# Patient Record
Sex: Male | Born: 1960 | Race: White | Hispanic: No | Marital: Married
Health system: Southern US, Community
[De-identification: ages and names within clinical notes are randomized; demographics above are authoritative.]

## PROBLEM LIST (undated history)

## (undated) DIAGNOSIS — F32A Depression, unspecified: Secondary | ICD-10-CM

## (undated) HISTORY — PX: WISDOM TOOTH EXTRACTION: SHX21

## (undated) HISTORY — PX: FOOT SURGERY: SHX648

## (undated) HISTORY — PX: COLONOSCOPY: SHX174

---

## 2019-06-30 ENCOUNTER — Other Ambulatory Visit: Payer: Self-pay | Admitting: Internal Medicine

## 2019-06-30 DIAGNOSIS — R200 Anesthesia of skin: Secondary | ICD-10-CM

## 2019-07-04 ENCOUNTER — Ambulatory Visit
Admission: RE | Admit: 2019-07-04 | Discharge: 2019-07-04 | Disposition: A | Discharge status: Home / Self Care | Payer: Managed Care, Other (non HMO) | Source: Ambulatory Visit | Attending: Internal Medicine | Admitting: Internal Medicine

## 2019-07-04 ENCOUNTER — Other Ambulatory Visit: Payer: Self-pay

## 2019-07-04 DIAGNOSIS — R200 Anesthesia of skin: Secondary | ICD-10-CM | POA: Insufficient documentation

## 2020-10-15 DIAGNOSIS — U071 COVID-19: Secondary | ICD-10-CM

## 2020-10-15 HISTORY — DX: COVID-19: U07.1

## 2022-03-28 ENCOUNTER — Other Ambulatory Visit: Payer: Self-pay | Admitting: Orthopedic Surgery

## 2022-03-28 DIAGNOSIS — S46211A Strain of muscle, fascia and tendon of other parts of biceps, right arm, initial encounter: Secondary | ICD-10-CM

## 2022-03-28 DIAGNOSIS — S46001A Unspecified injury of muscle(s) and tendon(s) of the rotator cuff of right shoulder, initial encounter: Secondary | ICD-10-CM

## 2022-04-10 ENCOUNTER — Ambulatory Visit
Admission: RE | Admit: 2022-04-10 | Discharge: 2022-04-10 | Disposition: A | Discharge status: Home / Self Care | Payer: Managed Care, Other (non HMO) | Source: Ambulatory Visit | Attending: Orthopedic Surgery | Admitting: Orthopedic Surgery

## 2022-04-10 DIAGNOSIS — S46001A Unspecified injury of muscle(s) and tendon(s) of the rotator cuff of right shoulder, initial encounter: Secondary | ICD-10-CM

## 2022-04-10 DIAGNOSIS — S46211A Strain of muscle, fascia and tendon of other parts of biceps, right arm, initial encounter: Secondary | ICD-10-CM

## 2022-04-17 ENCOUNTER — Other Ambulatory Visit: Payer: Self-pay | Admitting: Orthopedic Surgery

## 2022-04-25 ENCOUNTER — Encounter
Admission: RE | Admit: 2022-04-25 | Discharge: 2022-04-25 | Disposition: A | Discharge status: Home / Self Care | Payer: Managed Care, Other (non HMO) | Source: Ambulatory Visit | Attending: Orthopedic Surgery | Admitting: Orthopedic Surgery

## 2022-04-25 ENCOUNTER — Other Ambulatory Visit: Payer: Self-pay

## 2022-04-25 HISTORY — DX: Depression, unspecified: F32.A

## 2022-04-25 NOTE — Patient Instructions (Signed)
Your procedure is scheduled on: 05/02/22 Report to DAY SURGERY DEPARTMENT LOCATED ON 2ND FLOOR MEDICAL MALL ENTRANCE. To find out your arrival time please call [phone] between 1PM - 3PM on 05/01/22.  Remember: Instructions that are not followed completely may result in serious medical risk, up to and including death, or upon the discretion of your surgeon and anesthesiologist your surgery may need to be rescheduled.     _X__ 1. Do not eat food after midnight the night before your procedure.                 No gum chewing or hard candies. You may drink clear liquids up to 2 hours                 before you are scheduled to arrive for your surgery- DO not drink clear                 liquids within 2 hours of the start of your surgery.                 Clear Liquids include:  water, apple juice without pulp, clear carbohydrate                 drink such as Clearfast or Gatorade, Black Coffee or Tea (Do not add                 anything to coffee or tea). Diabetics water only  __X__2.  On the morning of surgery brush your teeth with toothpaste and water, you                 may rinse your mouth with mouthwash if you wish.  Do not swallow any              toothpaste of mouthwash.     _X__ 3.  No Alcohol for 24 hours before or after surgery.   _X__ 4.  Do Not Smoke or use e-cigarettes For 24 Hours Prior to Your Surgery.                 Do not use any chewable tobacco products for at least 6 hours prior to                 surgery.  ____  5.  Bring all medications with you on the day of surgery if instructed.   __X__  6.  Notify your doctor if there is any change in your medical condition      (cold, fever, infections).     Do not wear jewelry, make-up, hairpins, clips or nail polish. Do not wear lotions, powders, or perfumes. No deodorant Do not shave 48 hours prior to surgery. Men may shave face and neck. Do not bring valuables to the hospital.    [facility] is not responsible for any  belongings or valuables.  Contacts, dentures/partials or body piercings may not be worn into surgery. Bring a case for your contacts, glasses or hearing aids, a denture cup will be supplied. Leave your suitcase in the car. After surgery it may be brought to your room. For patients admitted to the hospital, discharge time is determined by your treatment team.   Patients discharged the day of surgery will not be allowed to drive home.    __X__ Take these medicines the morning of surgery with A SIP OF WATER:    1. none  2.   3.   4.  5.  6.  ____ Fleet Enema (as directed)   ____ Use CHG Soap/SAGE wipes as directed  ____ Use inhalers on the day of surgery  ____ Stop metformin/Janumet/Farxiga 2 days prior to surgery    ____ Take 1/2 of usual insulin dose the night before surgery. No insulin the morning          of surgery.   ____ Stop Blood Thinners Coumadin/Plavix/Xarelto/Pleta/Pradaxa/Eliquis/Effient/Aspirin  on   Or contact your Surgeon, Cardiologist or Medical Doctor regarding  ability to stop your blood thinners  __X__ Stop Anti-inflammatories 7 days before surgery such as Advil, Ibuprofen, Motrin,  BC or Goodies Powder, Naprosyn, Naproxen, Aleve, Aspirin   You may take Tylenol if needed.  __X__ Stop all herbals and supplements, fish oil or vitamins for 7 days  until after surgery.    ____ Bring C-Pap to the hospital.

## 2022-04-29 NOTE — Anesthesia Preprocedure Evaluation (Addendum)
Anesthesia Evaluation  Patient identified by MRN, date of birth, ID band Patient awake    Reviewed: Allergy & Precautions, NPO status , Patient's Chart, lab work & pertinent test results  Airway Mallampati: II  TM Distance: >3 FB Neck ROM: full    Dental no notable dental hx.    Pulmonary neg pulmonary ROS,    Pulmonary exam normal        Cardiovascular negative cardio ROS Normal cardiovascular exam     Neuro/Psych PSYCHIATRIC DISORDERS Depression negative neurological ROS     GI/Hepatic negative GI ROS, Neg liver ROS,   Endo/Other  negative endocrine ROS  Renal/GU      Musculoskeletal   Abdominal Normal abdominal exam  (+)   Peds  Hematology negative hematology ROS (+)   Anesthesia Other Findings Past Medical History: 10/2020: COVID     Comment:  mild No date: Depression  Past Surgical History: No date: COLONOSCOPY No date: FOOT SURGERY; Right     Comment:  bone spur No date: WISDOM TOOTH EXTRACTION     Reproductive/Obstetrics negative OB ROS                            Anesthesia Physical Anesthesia Plan  ASA: 2  Anesthesia Plan: General ETT   Post-op Pain Management: Regional block*   Induction: Intravenous  PONV Risk Score and Plan: 2 and Ondansetron, Dexamethasone and Midazolam  Airway Management Planned: Oral ETT  Additional Equipment:   Intra-op Plan:   Post-operative Plan: Extubation in OR  Informed Consent: I have reviewed the patients History and Physical, chart, labs and discussed the procedure including the risks, benefits and alternatives for the proposed anesthesia with the patient or authorized representative who has indicated his/her understanding and acceptance.     Dental Advisory Given  Plan Discussed with: Anesthesiologist, CRNA and Surgeon  Anesthesia Plan Comments:        Anesthesia Quick Evaluation

## 2022-05-02 ENCOUNTER — Other Ambulatory Visit: Payer: Self-pay

## 2022-05-02 ENCOUNTER — Encounter: Payer: Self-pay | Admitting: Orthopedic Surgery

## 2022-05-02 ENCOUNTER — Ambulatory Visit: Payer: Managed Care, Other (non HMO) | Admitting: Anesthesiology

## 2022-05-02 ENCOUNTER — Encounter
Admission: RE | Disposition: A | Discharge status: Home / Self Care | Payer: Self-pay | Source: Home / Self Care | Attending: Orthopedic Surgery

## 2022-05-02 ENCOUNTER — Ambulatory Visit: Payer: Managed Care, Other (non HMO)

## 2022-05-02 ENCOUNTER — Ambulatory Visit
Admission: RE | Admit: 2022-05-02 | Discharge: 2022-05-02 | Disposition: A | Discharge status: Home / Self Care | Payer: Managed Care, Other (non HMO) | Attending: Orthopedic Surgery | Admitting: Orthopedic Surgery

## 2022-05-02 DIAGNOSIS — M65811 Other synovitis and tenosynovitis, right shoulder: Secondary | ICD-10-CM | POA: Diagnosis not present

## 2022-05-02 DIAGNOSIS — M25811 Other specified joint disorders, right shoulder: Secondary | ICD-10-CM | POA: Insufficient documentation

## 2022-05-02 DIAGNOSIS — M19011 Primary osteoarthritis, right shoulder: Secondary | ICD-10-CM | POA: Insufficient documentation

## 2022-05-02 DIAGNOSIS — S46011A Strain of muscle(s) and tendon(s) of the rotator cuff of right shoulder, initial encounter: Secondary | ICD-10-CM | POA: Insufficient documentation

## 2022-05-02 DIAGNOSIS — S46211A Strain of muscle, fascia and tendon of other parts of biceps, right arm, initial encounter: Secondary | ICD-10-CM | POA: Insufficient documentation

## 2022-05-02 DIAGNOSIS — Y990 Civilian activity done for income or pay: Secondary | ICD-10-CM | POA: Diagnosis not present

## 2022-05-02 DIAGNOSIS — X500XXA Overexertion from strenuous movement or load, initial encounter: Secondary | ICD-10-CM | POA: Diagnosis not present

## 2022-05-02 HISTORY — PX: SHOULDER ARTHROSCOPY WITH SUBACROMIAL DECOMPRESSION AND OPEN ROTATOR C: SHX5688

## 2022-05-02 SURGERY — SHOULDER ARTHROSCOPY WITH SUBACROMIAL DECOMPRESSION AND OPEN ROTATOR CUFF REPAIR, OPEN BICEPS TENDON REPAIR
Anesthesia: General | Site: Shoulder | Laterality: Right

## 2022-05-02 MED ORDER — CHLORHEXIDINE GLUCONATE 0.12 % MT SOLN
OROMUCOSAL | Status: AC
  Filled 2022-05-02: qty 15

## 2022-05-02 MED ORDER — LIDOCAINE HCL (PF) 1 % IJ SOLN
INTRAMUSCULAR | Status: AC
  Filled 2022-05-02: qty 5

## 2022-05-02 MED ORDER — ACETAMINOPHEN 10 MG/ML IV SOLN
INTRAVENOUS | Status: DC | PRN
  Administered 2022-05-02: 1000 mg via INTRAVENOUS

## 2022-05-02 MED ORDER — FENTANYL CITRATE (PF) 100 MCG/2ML IJ SOLN
INTRAMUSCULAR | Status: AC
  Filled 2022-05-02: qty 2

## 2022-05-02 MED ORDER — FAMOTIDINE 20 MG PO TABS
20.0000 mg | ORAL_TABLET | Freq: Once | ORAL | Status: AC
  Administered 2022-05-02: 20 mg via ORAL

## 2022-05-02 MED ORDER — FENTANYL CITRATE PF 50 MCG/ML IJ SOSY: 50.0000 ug | PREFILLED_SYRINGE | Freq: Once | INTRAMUSCULAR | Status: DC

## 2022-05-02 MED ORDER — ORAL CARE MOUTH RINSE: 15.0000 mL | Freq: Once | OROMUCOSAL | Status: AC

## 2022-05-02 MED ORDER — FENTANYL CITRATE (PF) 100 MCG/2ML IJ SOLN: 25.0000 ug | INTRAMUSCULAR | Status: DC | PRN

## 2022-05-02 MED ORDER — OXYCODONE HCL 5 MG/5ML PO SOLN: 5.0000 mg | Freq: Once | ORAL | Status: DC | PRN

## 2022-05-02 MED ORDER — PROPOFOL 10 MG/ML IV BOLUS
INTRAVENOUS | Status: DC | PRN
  Administered 2022-05-02: 170 mg via INTRAVENOUS

## 2022-05-02 MED ORDER — KETAMINE HCL 10 MG/ML IJ SOLN
INTRAMUSCULAR | Status: DC | PRN
  Administered 2022-05-02: 10 mg via INTRAVENOUS
  Administered 2022-05-02: 20 mg via INTRAVENOUS

## 2022-05-02 MED ORDER — ASPIRIN 325 MG PO TBEC: 325.0000 mg | DELAYED_RELEASE_TABLET | Freq: Every day | ORAL | 0 refills | Status: AC

## 2022-05-02 MED ORDER — LIDOCAINE HCL (CARDIAC) PF 100 MG/5ML IV SOSY
PREFILLED_SYRINGE | INTRAVENOUS | Status: DC | PRN
  Administered 2022-05-02: 100 mg via INTRAVENOUS

## 2022-05-02 MED ORDER — FENTANYL CITRATE (PF) 100 MCG/2ML IJ SOLN
INTRAMUSCULAR | Status: DC | PRN
  Administered 2022-05-02: 25 ug via INTRAVENOUS
  Administered 2022-05-02 (×2): 50 ug via INTRAVENOUS
  Administered 2022-05-02: 25 ug via INTRAVENOUS
  Administered 2022-05-02: 100 ug via INTRAVENOUS

## 2022-05-02 MED ORDER — BUPIVACAINE LIPOSOME 1.3 % IJ SUSP
INTRAMUSCULAR | Status: AC
  Filled 2022-05-02: qty 10

## 2022-05-02 MED ORDER — MIDAZOLAM HCL 2 MG/2ML IJ SOLN
1.0000 mg | INTRAMUSCULAR | Status: AC | PRN
  Administered 2022-05-02 (×2): 1 mg via INTRAVENOUS

## 2022-05-02 MED ORDER — LIDOCAINE HCL (PF) 1 % IJ SOLN
INTRAMUSCULAR | Status: DC | PRN
  Administered 2022-05-02: 1 mL via SUBCUTANEOUS

## 2022-05-02 MED ORDER — LACTATED RINGERS IR SOLN: Status: DC | PRN

## 2022-05-02 MED ORDER — ACETAMINOPHEN 10 MG/ML IV SOLN: 1000.0000 mg | Freq: Once | INTRAVENOUS | Status: DC | PRN

## 2022-05-02 MED ORDER — DEXAMETHASONE SODIUM PHOSPHATE 10 MG/ML IJ SOLN
INTRAMUSCULAR | Status: DC | PRN
  Administered 2022-05-02: 10 mg via INTRAVENOUS

## 2022-05-02 MED ORDER — BUPIVACAINE LIPOSOME 1.3 % IJ SUSP
INTRAMUSCULAR | Status: DC | PRN
  Administered 2022-05-02: 10 mL via PERINEURAL

## 2022-05-02 MED ORDER — PHENYLEPHRINE HCL-NACL 20-0.9 MG/250ML-% IV SOLN
INTRAVENOUS | Status: AC
  Filled 2022-05-02: qty 250

## 2022-05-02 MED ORDER — FAMOTIDINE 20 MG PO TABS
ORAL_TABLET | ORAL | Status: AC
  Filled 2022-05-02: qty 1

## 2022-05-02 MED ORDER — EPINEPHRINE PF 1 MG/ML IJ SOLN
INTRAMUSCULAR | Status: AC
  Filled 2022-05-02: qty 4

## 2022-05-02 MED ORDER — MIDAZOLAM HCL 2 MG/2ML IJ SOLN
INTRAMUSCULAR | Status: AC
  Filled 2022-05-02: qty 2

## 2022-05-02 MED ORDER — OXYCODONE HCL 5 MG PO TABS: 5.0000 mg | ORAL_TABLET | Freq: Once | ORAL | Status: DC | PRN

## 2022-05-02 MED ORDER — LACTATED RINGERS IV SOLN: INTRAVENOUS | Status: DC

## 2022-05-02 MED ORDER — ACETAMINOPHEN 500 MG PO TABS: 1000.0000 mg | ORAL_TABLET | Freq: Three times a day (TID) | ORAL | 2 refills | Status: AC

## 2022-05-02 MED ORDER — PROPOFOL 10 MG/ML IV BOLUS
INTRAVENOUS | Status: AC
  Filled 2022-05-02: qty 20

## 2022-05-02 MED ORDER — BUPIVACAINE HCL (PF) 0.5 % IJ SOLN
INTRAMUSCULAR | Status: AC
  Filled 2022-05-02: qty 10

## 2022-05-02 MED ORDER — BUPIVACAINE HCL (PF) 0.5 % IJ SOLN
INTRAMUSCULAR | Status: DC | PRN
  Administered 2022-05-02: 10 mL via PERINEURAL

## 2022-05-02 MED ORDER — DEXMEDETOMIDINE HCL IN NACL 200 MCG/50ML IV SOLN
INTRAVENOUS | Status: DC | PRN
  Administered 2022-05-02: 8 ug via INTRAVENOUS

## 2022-05-02 MED ORDER — ONDANSETRON 4 MG PO TBDP: 4.0000 mg | ORAL_TABLET | Freq: Three times a day (TID) | ORAL | 0 refills | Status: AC | PRN

## 2022-05-02 MED ORDER — OXYCODONE HCL 5 MG PO TABS: 5.0000 mg | ORAL_TABLET | ORAL | 0 refills | Status: AC | PRN

## 2022-05-02 MED ORDER — CHLORHEXIDINE GLUCONATE 0.12 % MT SOLN
15.0000 mL | Freq: Once | OROMUCOSAL | Status: AC
  Administered 2022-05-02: 15 mL via OROMUCOSAL

## 2022-05-02 MED ORDER — LACTATED RINGERS IR SOLN
Status: DC | PRN
  Administered 2022-05-02 (×8): 3000 mL

## 2022-05-02 MED ORDER — DROPERIDOL 2.5 MG/ML IJ SOLN: 0.6250 mg | Freq: Once | INTRAMUSCULAR | Status: DC | PRN

## 2022-05-02 MED ORDER — CEFAZOLIN SODIUM-DEXTROSE 2-4 GM/100ML-% IV SOLN
2.0000 g | INTRAVENOUS | Status: AC
  Administered 2022-05-02: 2 g via INTRAVENOUS

## 2022-05-02 MED ORDER — ROCURONIUM BROMIDE 100 MG/10ML IV SOLN
INTRAVENOUS | Status: DC | PRN
  Administered 2022-05-02: 70 mg via INTRAVENOUS

## 2022-05-02 MED ORDER — LIDOCAINE HCL (PF) 2 % IJ SOLN
INTRAMUSCULAR | Status: AC
  Filled 2022-05-02: qty 5

## 2022-05-02 MED ORDER — EPHEDRINE SULFATE (PRESSORS) 50 MG/ML IJ SOLN
INTRAMUSCULAR | Status: DC | PRN
  Administered 2022-05-02: 5 mg via INTRAVENOUS
  Administered 2022-05-02: 10 mg via INTRAVENOUS

## 2022-05-02 MED ORDER — SUGAMMADEX SODIUM 200 MG/2ML IV SOLN
INTRAVENOUS | Status: DC | PRN
  Administered 2022-05-02: 200 mg via INTRAVENOUS

## 2022-05-02 MED ORDER — PROMETHAZINE HCL 25 MG/ML IJ SOLN: 6.2500 mg | INTRAMUSCULAR | Status: DC | PRN

## 2022-05-02 MED ORDER — MIDAZOLAM HCL 2 MG/2ML IJ SOLN
INTRAMUSCULAR | Status: DC | PRN
  Administered 2022-05-02: 2 mg via INTRAVENOUS

## 2022-05-02 MED ORDER — FENTANYL CITRATE PF 50 MCG/ML IJ SOSY
PREFILLED_SYRINGE | INTRAMUSCULAR | Status: AC
  Administered 2022-05-02: 50 ug
  Filled 2022-05-02: qty 1

## 2022-05-02 MED ORDER — KETAMINE HCL 50 MG/5ML IJ SOSY
PREFILLED_SYRINGE | INTRAMUSCULAR | Status: AC
  Filled 2022-05-02: qty 5

## 2022-05-02 MED ORDER — CEFAZOLIN SODIUM-DEXTROSE 2-4 GM/100ML-% IV SOLN
INTRAVENOUS | Status: AC
  Filled 2022-05-02: qty 100

## 2022-05-02 MED ORDER — ACETAMINOPHEN 10 MG/ML IV SOLN
INTRAVENOUS | Status: AC
  Filled 2022-05-02: qty 100

## 2022-05-02 MED ORDER — PHENYLEPHRINE HCL (PRESSORS) 10 MG/ML IV SOLN
INTRAVENOUS | Status: DC | PRN
  Administered 2022-05-02 (×2): 80 ug via INTRAVENOUS

## 2022-05-02 SURGICAL SUPPLY — 90 items
ADAPTER IRRIG TUBE 2 SPIKE SOL (ADAPTER) ×2 IMPLANT
ADH SKN CLS APL DERMABOND .7 (GAUZE/BANDAGES/DRESSINGS) ×1
ADPR TBG 2 SPK PMP STRL ASCP (ADAPTER) ×2
ANCH SUT 1.4 SUT TPE BLK/WHT (SUTURE) ×1
ANCH SUT 2 SWLK 19.1 CLS EYLT (Anchor) ×3 IMPLANT
ANCH SUT FBRTAPE 1.3X2.6X1.7 (Anchor) ×1 IMPLANT
ANCH SUT SHRT 12.5 CANN EYLT (Anchor) ×1 IMPLANT
ANCH SUT TGRTAPE 1.3X2.6X1.7 (Anchor) ×1 IMPLANT
ANCHOR SUT BIOCOMP LK 2.9X12.5 (Anchor) IMPLANT
ANCHOR SUT FBRTK 2.6 SOFT 1.7 (Anchor) IMPLANT
ANCHOR SUT FBRTK 2.6 SP1.7 TAP (Anchor) IMPLANT
ANCHOR SWIVELOCK BIO 4.75X19.1 (Anchor) ×1 IMPLANT
APL PRP STRL LF DISP 70% ISPRP (MISCELLANEOUS) ×1
BLADE SHAVER 4.5X7 STR FR (MISCELLANEOUS) ×1 IMPLANT
BNDG ADH 2 X3.75 FABRIC TAN LF (GAUZE/BANDAGES/DRESSINGS) ×1 IMPLANT
BNDG ADH XL 3.75X2 STRCH LF (GAUZE/BANDAGES/DRESSINGS) ×1
BUR BR 5.5 WIDE MOUTH (BURR) IMPLANT
CANNULA PART THRD DISP 5.75X7 (CANNULA) IMPLANT
CANNULA PARTIAL THREAD 2X7 (CANNULA) IMPLANT
CANNULA TWIST IN 8.25X9CM (CANNULA) IMPLANT
CHLORAPREP W/TINT 26 (MISCELLANEOUS) ×1 IMPLANT
COOLER POLAR GLACIER W/PUMP (MISCELLANEOUS) ×1 IMPLANT
DERMABOND ADVANCED .7 DNX12 (GAUZE/BANDAGES/DRESSINGS) IMPLANT
DEVICE SUCT BLK HOLE OR FLOOR (MISCELLANEOUS) IMPLANT
DRAPE 3/4 80X56 (DRAPES) ×1 IMPLANT
DRAPE INCISE IOBAN 66X45 STRL (DRAPES) ×1 IMPLANT
DRAPE U-SHAPE 47X51 STRL (DRAPES) ×2 IMPLANT
DRSG TEGADERM 4X4.75 (GAUZE/BANDAGES/DRESSINGS) ×3 IMPLANT
ELECT REM PT RETURN 9FT ADLT (ELECTROSURGICAL) ×1
ELECTRODE REM PT RTRN 9FT ADLT (ELECTROSURGICAL) ×1 IMPLANT
GAUZE SPONGE 4X4 12PLY STRL (GAUZE/BANDAGES/DRESSINGS) ×1 IMPLANT
GAUZE XEROFORM 1X8 LF (GAUZE/BANDAGES/DRESSINGS) ×1 IMPLANT
GLOVE BIO SURGEON STRL SZ7.5 (GLOVE) ×1 IMPLANT
GLOVE BIOGEL PI IND STRL 8 (GLOVE) ×2 IMPLANT
GLOVE SURG ORTHO 8.0 STRL STRW (GLOVE) ×1 IMPLANT
GLOVE SURG SYN 8.0 (GLOVE) ×1 IMPLANT
GLOVE SURG SYN 8.0 PF PI (GLOVE) ×1 IMPLANT
GOWN STRL REUS W/ TWL LRG LVL3 (GOWN DISPOSABLE) ×2 IMPLANT
GOWN STRL REUS W/TWL LRG LVL3 (GOWN DISPOSABLE) ×2
GOWN STRL REUS W/TWL XL LVL4 (GOWN DISPOSABLE) ×1 IMPLANT
IV LACTATED RINGER IRRG 3000ML (IV SOLUTION) ×12
IV LR IRRIG 3000ML ARTHROMATIC (IV SOLUTION) ×4 IMPLANT
KIT ANCHOR FBRTK 2.6 STR (KITS) IMPLANT
KIT CORKSCREW KNTLS 3.9 S/T/P (INSTRUMENTS) IMPLANT
KIT INSERTION 2.9 PUSHLOCK (KITS) IMPLANT
KIT STABILIZATION SHOULDER (MISCELLANEOUS) ×1 IMPLANT
KIT SUTURETAK 3.0 INSERT PERC (KITS) IMPLANT
KIT TURNOVER KIT A (KITS) ×1 IMPLANT
MANIFOLD NEPTUNE II (INSTRUMENTS) ×2 IMPLANT
MASK FACE SPIDER DISP (MASK) ×1 IMPLANT
MAT ABSORB  FLUID 56X50 GRAY (MISCELLANEOUS) ×2
MAT ABSORB FLUID 56X50 GRAY (MISCELLANEOUS) ×2 IMPLANT
NDL MAYO CATGUT SZ5 (NEEDLE)
NDL SAFETY ECLIP 18X1.5 (MISCELLANEOUS) ×1 IMPLANT
NDL SCORPION MULTI FIRE (NEEDLE) IMPLANT
NDL SUT 5 .5 CRC TPR PNT MAYO (NEEDLE) IMPLANT
NEEDLE SCORPION MULTI FIRE (NEEDLE) IMPLANT
PACK ARTHROSCOPY SHOULDER (MISCELLANEOUS) ×1 IMPLANT
PAD ARMBOARD 7.5X6 YLW CONV (MISCELLANEOUS) ×1 IMPLANT
PAD WRAPON POLAR SHDR XLG (MISCELLANEOUS) ×1 IMPLANT
PASSER SUT FIRSTPASS SELF (INSTRUMENTS) ×1 IMPLANT
PASSER SUT SWIFTSTITCH HIP CRT (INSTRUMENTS) ×1 IMPLANT
SHAVER BLADE BONE CUTTER  5.5 (BLADE)
SHAVER BLADE BONE CUTTER 5.5 (BLADE) ×1 IMPLANT
SLEEVE REMOTE CONTROL 5X12 (DRAPES) IMPLANT
SLING ULTRA II M (MISCELLANEOUS) ×1 IMPLANT
SPONGE T-LAP 18X18 ~~LOC~~+RFID (SPONGE) ×1 IMPLANT
STAPLER SKIN PROX 35W (STAPLE) IMPLANT
STRAP SAFETY 5IN WIDE (MISCELLANEOUS) ×1 IMPLANT
SUT ETHILON 3-0 (SUTURE) ×1 IMPLANT
SUT LASSO 90 DEG CVD (SUTURE) IMPLANT
SUT LASSO 90 DEG SD STR (SUTURE) IMPLANT
SUT MNCRL 4-0 (SUTURE) ×1
SUT MNCRL 4-0 27XMFL (SUTURE) ×1
SUT PROLENE 0 CT 2 (SUTURE) IMPLANT
SUT VIC AB 0 CT1 36 (SUTURE) IMPLANT
SUT VIC AB 2-0 CT2 27 (SUTURE) IMPLANT
SUT XBRAID 1.4 BLK/WHT (SUTURE) IMPLANT
SUTURE MNCRL 4-0 27XMF (SUTURE) IMPLANT
SUTURE TAPE 1.3 40 TPR END (SUTURE) IMPLANT
SUTURETAPE 1.3 40 TPR END (SUTURE) ×2
TAPE CLOTH 3X10 WHT NS LF (GAUZE/BANDAGES/DRESSINGS) ×1 IMPLANT
TAPE MICROFOAM 4IN (TAPE) ×1 IMPLANT
TRAP FLUID SMOKE EVACUATOR (MISCELLANEOUS) ×1 IMPLANT
TUBING CONNECTING 10 (TUBING) IMPLANT
TUBING INFLOW SET DBFLO PUMP (TUBING) ×1 IMPLANT
TUBING OUTFLOW SET DBLFO PUMP (TUBING) ×1 IMPLANT
WAND WEREWOLF FLOW 90D (MISCELLANEOUS) ×1 IMPLANT
WATER STERILE IRR 500ML POUR (IV SOLUTION) ×1 IMPLANT
WRAPON POLAR PAD SHDR XLG (MISCELLANEOUS) ×1

## 2022-05-02 NOTE — Op Note (Addendum)
SURGERY DATE: 05/02/2022   PRE-OP DIAGNOSIS:  1. Right subacromial impingement 2. Right proximal biceps tendon rupture 3. Right rotator cuff tear (subscapularis and supraspinatus) 4. Right acromioclavicular joint arthritis  POST-OP DIAGNOSIS: 1. Right subacromial impingement 2. Right proximal biceps tendon rupture 3. Right rotator cuff tear (subscapularis and supraspinatus) 4. Right acromioclavicular joint arthritis  PROCEDURES:  1. Right arthroscopic rotator cuff repair (subscapularis and supraspinatus) 2. Right arthroscopic biceps tendon debridement 3. Right arthroscopic subacromial decompression 4. Right arthroscopic extensive debridement of shoulder (glenohumeral and subacromial spaces) 5. Right arthroscopic distal clavicle excision  SURGEON: Cato Mulligan, MD   ANESTHESIA: Gen with Exparel interscalene block   ESTIMATED BLOOD LOSS: 5cc   DRAINS:  none   TOTAL IV FLUIDS: per anesthesia      SPECIMENS: none   IMPLANTS:  - Arthrex 2.82mm PushLock x 1 - Arthrex 4.35mm SwiveLock x 3 - Arthrex 2.104mm FiberTak RC x 2     OPERATIVE FINDINGS:  Examination under anesthesia: A careful examination under anesthesia was performed.  Passive range of motion was: FF: 150; ER at side: 45; ER in abduction: 90; IR in abduction: 45.  Anterior load shift: NT.  Posterior load shift: NT.  Sulcus in neutral: NT.  Sulcus in ER: NT.     Intra-operative findings: A thorough arthroscopic examination of the shoulder was performed.  The findings are: 1. Biceps tendon: Rupture of the proximal biceps tendon with remnant stump within the intra-articular space 2. Superior labrum: erythema 3. Posterior labrum and capsule: normal 4. Inferior capsule and inferior recess: normal 5. Glenoid cartilage surface: Normal 6. Supraspinatus attachment: full-thickness tear of the supraspinatus 7. Posterior rotator cuff attachment: normal 8. Humeral head articular cartilage: normal 9. Rotator interval:  significant synovitis 10: Subscapularis tendon: Full-thickness tear with minimal retraction 11. Anterior labrum: Mildly degenerative 12. IGHL: normal   OPERATIVE REPORT:    Indications for procedure:  Gabriel Hunt is a 61 y.o. male with approximately 6 weeks of right shoulder pain after he felt a tearing sensation while picking up heavy boxes while at work.  He has had difficulty with overhead motion since that time with sensations of weakness.  Is also noted a Popeye deformity.  Clinical exam and MRI were suggestive of rotator cuff tear involving the supraspinatus and subscapularis, proximal biceps tendon rupture, acromioclavicular joint arthritis and subacromial impingement. After discussion of risks, benefits, and alternatives to surgery, the patient elected to proceed.    Procedure in detail:   I identified Gabriel Hunt in the pre-operative holding area.  I marked the operative shoulder with my initials. I reviewed the risks and benefits of the proposed surgical intervention, and the patient wished to proceed.  Anesthesia was then performed with an Exparel interscalene block.  The patient was transferred to the operative suite and placed in the beach chair position.     Appropriate IV antibiotics were administered prior to incision. The operative upper extremity was then prepped and draped in standard fashion. A time out was performed confirming the correct extremity, correct patient, and correct procedure.    I then created a standard posterior portal with an 11 blade. The glenohumeral joint was easily entered with a blunt trocar and the arthroscope introduced. The findings of diagnostic arthroscopy are described above.  There is approximately 2-3 cm of biceps tendon stump attached to the superior labrum.  This was debrided using combination of an arthroscopic biter and oscillating shaver.  Next, I debrided degenerative tissue including the synovitic tissue about the  rotator interval and anterior and  superior labrum. I then coagulated the inflamed synovium to obtain hemostasis and reduce the risk of post-operative swelling using an Arthrocare radiofrequency device.   The subscapularis tear was identified.  A superior anterolateral portal was made under needle localization.  An 8.6mm cannula was placed.  The, comma tissue indicating the superolateral border of the subscapularis was identified readily.  The tip of the coracoid as well as the conjoined tendon and coracoacromial ligaments were visualized after debriding rotator interval tissue. Tissue about the subscapularis was released anteriorly, superiorly, and posteriorly to allow for improved mobilization. The lesser tuberosity footprint was prepared with a combination of electrocautery and burr. 2 suture tapes were passed in a mattress fashion.  All 4 strands of suture were then loaded onto a 4.75 mm SwiveLock anchor and placed into the prepared footprint with the arm in a neutral position.  This construct appropriately reduced the subscapularis tear.  To further reduce the upper border, another suture tape was passed in a mattress fashion over the superior aspect of the subscapularis.  This was loaded onto a 2.9 mm push lock anchor.  The anchor was then impacted into place into the superior aspect of the footprint.  This allowed for an excellent anatomic reduction.  The arm was then internally and externally rotated and the subscapularis was noted to move appropriately with rotation.  The remainder of the suture was then cut.   Next, the arthroscope was then introduced into the subacromial space. A direct lateral portal was created with an 11-blade after spinal needle localization. An extensive subacromial bursectomy and debridement was performed using a combination of the shaver and Arthrocare wand. The entire acromial undersurface was exposed and the CA ligament was subperiosteally elevated to expose the anterior acromial hook. A burr was used to  create a flat anterior and lateral aspect of the acromion, converting it from a Type 2 to a Type 1 acromion. Care was made to keep the deltoid fascia intact.   I then turned my attention to the arthroscopic distal clavicle excision. I identified the acromioclavicular joint. Surrounding bursal tissue was debrided and the edges of the joint were identified. I used the 5.69mm barrel burr to remove the distal clavicle parallel to the edge of the acromion. I was able to fit two widths of the burr into the space between the distal clavicle and acromion, signifying that I had removed ~55mm of distal clavicle. This was confirmed by viewing anteriorly and introducing a probe with measuring marks from the lateral portal. Hemostasis was achieved with an Arthrocare wand.   Next, I created an accessory posterolateral portal to assist with visualization and instrumentation.  I debrided the poor quality edges of the supraspinatus tendon.  This was a U-shaped tear of the supraspinatus.  I prepared the footprint using a burr to expose bleeding bone.     I then percutaneously placed 1 Arthrex Fibertak medial row anchor along the anterior portion of the tear at the articular margin. Another Fibertak anchor was placed along the posterior portion of the tear at the articular margin. I then shuttled all the strands of tape through the rotator cuff just lateral to the musculotendinous junction using a FirstPass suture passer spanning the anterior to posterior extent of the tear. The posterior strands of each suture were passed through an [employer] anchor.  This was placed approximately 2 cm distal to the lateral edge of the footprint in line with the posterior aspect of the  tear with appropriate tensioning of each suture prior to final fixation.  Similarly, the anterior strands of each suture were passed through another SwiveLock anchor along the anterior margin of the tear.  There were one small dogear anteriorly.  The  knotless mechanism of the SwiveLock anchors was utilized to reduce this.  This construct allowed for excellent reapproximation of the rotator cuff to its native footprint without undue tension.  Appropriate compression was achieved.  The repair was stable to external and internal rotation.   Fluid was evacuated from the shoulder, and the portals were closed with 3-0 Nylon. Xeroform was applied to the portals. A sterile dressing was applied, followed by a Polar Care sleeve and a SlingShot shoulder immobilizer/sling. The patient was awakened from anesthesia without difficulty and was transferred to the PACU in stable condition.   Additionally, this case had increased complexity compared to standard arthroscopic rotator cuff repair given the involvement of the subscapularis tear. Repair of this tear increased surgical time by 30 minutes and increased complexity due to additional preparation and repair of subscapularis tear, use of additional implants, and creation of an accessory portal, all of which would have otherwise not occurred.  COMPLICATIONS: none   DISPOSITION: plan for discharge home after recovery in PACU     POSTOPERATIVE PLAN: Remain in sling (except hygiene and elbow/wrist/hand RoM exercises as instructed by PT) x 6 weeks and NWB for this time. PT to begin 3-4 days after surgery.  Large rotator cuff repair rehab protocol with subscapularis restrictions. ASA 325mg  daily x 2 weeks for DVT ppx.

## 2022-05-02 NOTE — Discharge Instructions (Addendum)
Post-Op Instructions - Rotator Cuff Repair  1. Bracing: You will wear a shoulder immobilizer or sling for 6 weeks.   2. Driving: No driving for 3 weeks post-op. When driving, do not wear the immobilizer. Ideally, we recommend no driving for 6 weeks while sling is in place as one arm will be immobilized.   3. Activity: No active lifting for 2 months. Wrist, hand, and elbow motion only. Avoid lifting the upper arm away from the body except for hygiene. You are permitted to bend and straighten the elbow passively only (no active elbow motion). You may use your hand and wrist for typing, writing, and managing utensils (cutting food). Do not lift more than a coffee cup for 8 weeks.  When sleeping or resting, inclined positions (recliner chair or wedge pillow) and a pillow under the forearm for support may provide better comfort for up to 4 weeks.  Avoid long distance travel for 4 weeks.  Return to normal activities after rotator cuff repair repair normally takes 6 months on average. If rehab goes very well, may be able to do most activities at 4 months, except overhead or contact sports.  4. Physical Therapy: Begins 3-4 days after surgery, and proceed 1 time per week for the first 6 weeks, then 1-2 times per week from weeks 6-20 post-op.  5. Medications:  - You will be provided a prescription for narcotic pain medicine. After surgery, take 1-2 narcotic tablets every 4 hours if needed for severe pain.  - A prescription for anti-nausea medication will be provided in case the narcotic medicine causes nausea - take 1 tablet every 6 hours only if nauseated.   - Take tylenol 1000 mg (2 Extra Strength tablets or 3 regular strength) every 8 hours for pain.  May decrease or stop tylenol 5 days after surgery if you are having minimal pain. - Take ASA 325mg/day x 2 weeks to help prevent DVTs/PEs (blood clots).  - DO NOT take ANY nonsteroidal anti-inflammatory pain medications (Advil, Motrin, Ibuprofen, Aleve,  Naproxen, or Naprosyn). These medicines can inhibit healing of your shoulder repair.    If you are taking prescription medication for anxiety, depression, insomnia, muscle spasm, chronic pain, or for attention deficit disorder, you are advised that you are at a higher risk of adverse effects with use of narcotics post-op, including narcotic addiction/dependence, depressed breathing, death. If you use non-prescribed substances: alcohol, marijuana, cocaine, heroin, methamphetamines, etc., you are at a higher risk of adverse effects with use of narcotics post-op, including narcotic addiction/dependence, depressed breathing, death. You are advised that taking > 50 morphine milligram equivalents (MME) of narcotic pain medication per day results in twice the risk of overdose or death. For your prescription provided: oxycodone 5 mg - taking more than 6 tablets per day would result in > 50 morphine milligram equivalents (MME) of narcotic pain medication. Be advised that we will prescribe narcotics short-term, for acute post-operative pain only - 3 weeks for major operations such as shoulder repair/reconstruction surgeries.     6. Post-Op Appointment:  Your first post-op appointment will be 10-14 days post-op.  7. Work or School: For most, but not all procedures, we advise staying out of work or school for at least 1 to 2 weeks in order to recover from the stress of surgery and to allow time for healing.   If you need a work or school note this can be provided.   8. Smoking: If you are a smoker, you need to refrain from   smoking in the postoperative period. The nicotine in cigarettes will inhibit healing of your shoulder repair and decrease the chance of successful repair. Similarly, nicotine containing products (gum, patches) should be avoided.   Post-operative Brace: Apply and remove the brace you received as you were instructed to at the time of fitting and as described in detail as the brace's  instructions for use indicate.  Wear the brace for the period of time prescribed by your physician.  The brace can be cleaned with soap and water and allowed to air dry only.  Should the brace result in increased pain, decreased feeling (numbness/tingling), increased swelling or an overall worsening of your medical condition, please contact your doctor immediately.  If an emergency situation occurs as a result of wearing the brace after normal business hours, please dial 911 and seek immediate medical attention.  Let your doctor know if you have any further questions about the brace issued to you. Refer to the shoulder sling instructions for use if you have any questions regarding the correct fit of your shoulder sling.  [facility] Customer Care for Troubleshooting: [phone]  Video that illustrates how to properly use a shoulder sling: "Instructions for Proper Use of an Orthopaedic Sling" http://bass.com/    AMBULATORY SURGERY  DISCHARGE INSTRUCTIONS   The drugs that you were given will stay in your system until tomorrow so for the next 24 hours you should not:  Drive an automobile Make any legal decisions Drink any alcoholic beverage   You may resume regular meals tomorrow.  Today it is better to start with liquids and gradually work up to solid foods.  You may eat anything you prefer, but it is better to start with liquids, then soup and crackers, and gradually work up to solid foods.   Please notify your doctor immediately if you have any unusual bleeding, trouble breathing, redness and pain at the surgery site, drainage, fever, or pain not relieved by medication.    Additional Instructions:   PLEASE DO NOT REMOVE GREEN ARMBAND FOR 4 DAYS      Please contact your physician with any problems or Same Day Surgery at [phone], Monday through Friday 6 am to 4 pm, or [place] at [facility] [address] at [phone].   POLAR CARE  INFORMATION  MassAdvertisement.it  How to use Breg Polar Care [facility] Therapy System?  YouTube   ShippingScam.co.uk  OPERATING INSTRUCTIONS  Start the product With dry hands, connect the transformer to the electrical connection located on the top of the cooler. Next, plug the transformer into an appropriate electrical outlet. The unit will automatically start running at this point.  To stop the pump, disconnect electrical power.  Unplug to stop the product when not in use. Unplugging the Polar Care unit turns it off. Always unplug immediately after use. Never leave it plugged in while unattended. Remove pad.    FIRST ADD WATER TO FILL LINE, THEN ICE---Replace ice when existing ice is almost melted  1 Discuss Treatment with your Licensed Health Care Practitioner and Use Only as Prescribed 2 Apply Insulation Barrier & Cold Therapy Pad 3 Check for Moisture 4 Inspect Skin Regularly  Tips and Trouble Shooting Usage Tips 1. Use cubed or chunked ice for optimal performance. 2. It is recommended to drain the Pad between uses. To drain the pad, hold the Pad upright with the hose pointed toward the ground. Depress the black plunger and allow water to drain out. 3. You may disconnect the Pad from the  unit without removing the pad from the affected area by depressing the silver tabs on the hose coupling and gently pulling the hoses apart. The Pad and unit will seal itself and will not leak. Note: Some dripping during release is normal. 4. DO NOT RUN PUMP WITHOUT WATER! The pump in this unit is designed to run with water. Running the unit without water will cause permanent damage to the pump. 5. Unplug unit before removing lid.  TROUBLESHOOTING GUIDE Pump not running, Water not flowing to the pad, Pad is not getting cold 1. Make sure the transformer is plugged into the wall outlet. 2. Confirm that the ice and water are filled to the indicated levels. 3. Make sure there are  no kinks in the pad. 4. Gently pull on the blue tube to make sure the tube/pad junction is straight. 5. Remove the pad from the treatment site and ll it while the pad is lying at; then reapply. 6. Confirm that the pad couplings are securely attached to the unit. Listen for the double clicks (Figure 1) to confirm the pad couplings are securely attached.  Leaks    Note: Some condensation on the lines, controller, and pads is unavoidable, especially in warmer climates. 1. If using a Breg Polar Care Cold Therapy unit with a detachable Cold Therapy Pad, and a leak exists (other than condensation on the lines) disconnect the pad couplings. Make sure the silver tabs on the couplings are depressed before reconnecting the pad to the pump hose; then confirm both sides of the coupling are properly clicked in. 2. If the coupling continues to leak or a leak is detected in the pad itself, stop using it and call [facility] at [phone].  Cleaning After use, empty and dry the unit with a soft cloth. Warm water and mild detergent may be used occasionally to clean the pump and tubes.  WARNING: The [place] can be cold enough to cause serious injury, including full skin necrosis. Follow these Operating Instructions, and carefully read the Product Insert (see pouch on side of unit) and the Cold Therapy Pad Fitting Instructions (provided with each Cold Therapy Pad) prior to use.      Interscalene Nerve Block with Exparel   For your surgery you have received an Interscalene Nerve Block with Exparel. Nerve Blocks affect many types of nerves, including nerves that control movement, pain and normal sensation.  You may experience feelings such as numbness, tingling, heaviness, weakness or the inability to move your arm or the feeling or sensation that your arm has "fallen asleep". A nerve block with Exparel can last up to 5 days.  Usually the weakness wears off first.  The tingling and heaviness usually  wear off next.  Finally you may start to notice pain.  Keep in mind that this may occur in any order.  Once a nerve block starts to wear off it is usually completely gone within 60 minutes. ISNB may cause mild shortness of breath, a hoarse voice, blurry vision, unequal pupils, or drooping of the face on the same side as the nerve block.  These symptoms will usually resolve with the numbness.  Very rarely the procedure itself can cause mild seizures. If needed, your surgeon will give you a prescription for pain medication.  It will take about 60 minutes for the oral pain medication to become fully effective.  So, it is recommended that you start taking this medication before the nerve block first begins to  wear off, or when you first begin to feel discomfort. Take your pain medication only as prescribed.  Pain medication can cause sedation and decrease your breathing if you take more than you need for the level of pain that you have. Nausea is a common side effect of many pain medications.  You may want to eat something before taking your pain medicine to prevent nausea. After an Interscalene nerve block, you cannot feel pain, pressure or extremes in temperature in the effected arm.  Because your arm is numb it is at an increased risk for injury.  To decrease the possibility of injury, please practice the following:  While you are awake change the position of your arm frequently to prevent too much pressure on any one area for prolonged periods of time.  If you have a cast or tight dressing, check the color or your fingers every couple of hours.  Call your surgeon with the appearance of any discoloration (white or blue). If you are given a sling to wear before you go home, please wear it  at all times until the block has completely worn off.  Do not get up at night without your sling. Please contact [facility] or your surgeon if you do not begin to regain sensation after 7 days from the surgery.   Anesthesia may be contacted by calling the Same Day Surgery Department, Mon. through Fri., 6 am to 4 pm at [phone].   If you experience any other problems or concerns, please contact your surgeon's office. If you experience severe or prolonged shortness of breath go to the nearest emergency department.

## 2022-05-02 NOTE — Anesthesia Procedure Notes (Signed)
Anesthesia Regional Block: Interscalene brachial plexus block   Pre-Anesthetic Checklist: , timeout performed,  Correct Patient, Correct Site, Correct Laterality,  Correct Procedure, Correct Position, site marked,  Risks and benefits discussed,  Surgical consent,  Pre-op evaluation,  At surgeon's request and post-op pain management  Laterality: Upper and Right  Prep: chloraprep       Needles:  Injection technique: Single-shot  Needle Type: Stimiplex     Needle Length: 9cm  Needle Gauge: 22     Additional Needles:   Procedures:,,,, ultrasound used (permanent image in chart),,    Narrative:  Start time: 05/02/2022 11:00 AM End time: 05/02/2022 11:10 AM Injection made incrementally with aspirations every 5 mL.  Performed by: Personally  Anesthesiologist: Iran Ouch, MD  Additional Notes: Patient consented for risk and benefits of nerve block including but not limited to nerve damage, failed block, bleeding and infection.  Patient voiced understanding.  Functioning IV was confirmed and monitors were applied.  Timeout done prior to procedure and prior to any sedation being given to the patient.  Patient confirmed procedure site prior to any sedation given to the patient. Sterile prep,hand hygiene and sterile gloves were used.  Minimal sedation used for procedure.  No paresthesia endorsed by patient during the procedure.  Negative aspiration and negative test dose prior to incremental administration of local anesthetic. The patient tolerated the procedure well with no immediate complications.

## 2022-05-02 NOTE — H&P (Signed)
Paper H&P to be scanned into permanent record. H&P reviewed. No significant changes noted.  

## 2022-05-02 NOTE — Transfer of Care (Signed)
Immediate Anesthesia Transfer of Care Note  Patient: Findley Vi  Procedure(s) Performed: Right shoulder arthroscopic rotator cuff repair (subscapularis and supraspinatus), subacromial decompression, and distal clavicle excision (Right: Shoulder)  Patient Location: PACU  Anesthesia Type:General  Level of Consciousness: drowsy  Airway & Oxygen Therapy: Patient Spontanous Breathing and Patient connected to face mask oxygen  Post-op Assessment: Report given to RN and Post -op Vital signs reviewed and stable  Post vital signs: Reviewed and stable  Last Vitals:  Vitals Value Taken Time  BP 120/80 05/02/22 1545  Temp    Pulse 75 05/02/22 1547  Resp 20 05/02/22 1547  SpO2 100 % 05/02/22 1547  Vitals shown include unvalidated device data.  Last Pain:  Vitals:   05/02/22 0922  TempSrc: Oral         Complications: No notable events documented.

## 2022-05-02 NOTE — Anesthesia Procedure Notes (Signed)
Procedure Name: Intubation Date/Time: 05/02/2022 12:08 PM  Performed by: Biagio Borg, CRNAPre-anesthesia Checklist: Patient identified, Emergency Drugs available, Suction available and Patient being monitored Patient Re-evaluated:Patient Re-evaluated prior to induction Oxygen Delivery Method: Circle system utilized Preoxygenation: Pre-oxygenation with 100% oxygen Induction Type: IV induction Ventilation: Mask ventilation without difficulty Laryngoscope Size: McGraph and 4 Grade View: Grade I Tube type: Oral Tube size: 7.5 mm Number of attempts: 1 Airway Equipment and Method: Stylet and Oral airway Placement Confirmation: ETT inserted through vocal cords under direct vision, positive ETCO2 and breath sounds checked- equal and bilateral Secured at: 22 cm Tube secured with: Tape Dental Injury: Teeth and Oropharynx as per pre-operative assessment

## 2022-05-03 ENCOUNTER — Encounter: Payer: Self-pay | Admitting: Orthopedic Surgery

## 2022-05-03 NOTE — Anesthesia Postprocedure Evaluation (Signed)
Anesthesia Post Note  Patient: Gabriel Hunt  Procedure(s) Performed: Right shoulder arthroscopic rotator cuff repair (subscapularis and supraspinatus), subacromial decompression, and distal clavicle excision (Right: Shoulder)  Patient location during evaluation: PACU Anesthesia Type: General Level of consciousness: awake and alert Pain management: pain level controlled Vital Signs Assessment: post-procedure vital signs reviewed and stable Respiratory status: spontaneous breathing, nonlabored ventilation and respiratory function stable Cardiovascular status: blood pressure returned to baseline and stable Postop Assessment: no apparent nausea or vomiting Anesthetic complications: no   No notable events documented.   Last Vitals:  Vitals:   05/02/22 1639 05/02/22 1739  BP: 134/77   Pulse: 73 74  Resp: 16 16  Temp: (!) 36.2 C   SpO2: 100% 98%    Last Pain:  Vitals:   05/02/22 1739  TempSrc:   PainSc: 2                  Iran Ouch

## 2022-08-09 ENCOUNTER — Other Ambulatory Visit: Payer: Self-pay | Admitting: Orthopedic Surgery

## 2022-08-09 DIAGNOSIS — M25512 Pain in left shoulder: Secondary | ICD-10-CM

## 2022-08-21 ENCOUNTER — Ambulatory Visit
Admission: RE | Admit: 2022-08-21 | Discharge: 2022-08-21 | Disposition: A | Discharge status: Home / Self Care | Payer: Managed Care, Other (non HMO) | Source: Ambulatory Visit | Attending: Orthopedic Surgery | Admitting: Orthopedic Surgery

## 2022-08-21 DIAGNOSIS — M25512 Pain in left shoulder: Secondary | ICD-10-CM

## 2024-04-18 ENCOUNTER — Other Ambulatory Visit: Payer: Self-pay | Admitting: Orthopedic Surgery

## 2024-04-18 DIAGNOSIS — M75112 Incomplete rotator cuff tear or rupture of left shoulder, not specified as traumatic: Secondary | ICD-10-CM

## 2024-04-22 ENCOUNTER — Inpatient Hospital Stay: Admission: RE | Admit: 2024-04-22 | Source: Ambulatory Visit

## 2024-08-22 ENCOUNTER — Other Ambulatory Visit: Payer: Self-pay | Admitting: Orthopedic Surgery

## 2024-08-22 DIAGNOSIS — M75112 Incomplete rotator cuff tear or rupture of left shoulder, not specified as traumatic: Secondary | ICD-10-CM
